# Patient Record
Sex: Male | Born: 2000 | Hispanic: Refuse to answer | Marital: Single | Smoking: Never smoker
Health system: Southern US, Community
[De-identification: ages and names within clinical notes are randomized; demographics above are authoritative.]

## PROBLEM LIST (undated history)

## (undated) DIAGNOSIS — J45909 Unspecified asthma, uncomplicated: Secondary | ICD-10-CM

## (undated) HISTORY — DX: Unspecified asthma, uncomplicated: J45.909

---
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
## 2018-01-06 DIAGNOSIS — M66859 Spontaneous rupture of other tendons, unspecified thigh: Secondary | ICD-10-CM | POA: Diagnosis not present

## 2018-03-31 ENCOUNTER — Ambulatory Visit (INDEPENDENT_AMBULATORY_CARE_PROVIDER_SITE_OTHER): Payer: BLUE CROSS/BLUE SHIELD | Admitting: Family Medicine

## 2018-03-31 ENCOUNTER — Encounter: Payer: Self-pay | Admitting: Family Medicine

## 2018-03-31 VITALS — BP 144/76 | HR 78 | Temp 97.9°F | Resp 14

## 2018-03-31 DIAGNOSIS — J069 Acute upper respiratory infection, unspecified: Secondary | ICD-10-CM

## 2018-03-31 DIAGNOSIS — J4521 Mild intermittent asthma with (acute) exacerbation: Secondary | ICD-10-CM

## 2018-03-31 MED ORDER — DOXYCYCLINE HYCLATE 100 MG PO TABS: 100.0000 mg | ORAL_TABLET | Freq: Two times a day (BID) | ORAL | 0 refills | Status: DC

## 2018-03-31 NOTE — Progress Notes (Signed)
Patient presents today with symptoms of mild productive cough, nasal drainage, wheezing, shortness of breath for the last 4 days.  Patient has been using his Albuterol Nebulizer at home 5 times a day for relief of symptoms. He has been using his Albuterol Inhaler about twice a day.  Patient is also on Qvar and his mom has refilled the medication and is sending it to him today.  He has had a subjective fever. He is able to talk in complete sentences in the office today without difficulty. He denies any known allergens he has been around but suspects there maybe some dust since the heat has now been turned on where he lives.  He denies severe headache, nausea, vomiting, diarrhea, abdominal pain, extreme fatigue, chest pain, calf pain/swelling.  He denies smoking. Patient was seen back in the spring for similar symptoms induced by an allergen.  He states that since that time he has been well and has had no significant issues with his asthma.  He however does admit that he has been out of usual physical activity for a few months given his hamstring injury.  ROS: Negative except mentioned above. Vitals as per Epic. GENERAL: obese male, NAD HEENT: no significant pharyngeal erythema, no exudate, no erythema of TMs, no significant cervical LAD RESP: minimal expiratory wheeze at the bases, no accessory muscle use, can speak in complete sentences when talking CARD: RRR NEURO: CN II-XII grossly intact   A/P:URI, Asthma w/Exacerbation - will have patient start tapered oral steroids for 12 days, Doxycycline, continue inhalers and nebulizer treatment as prescribed and needed, encouraged patient to start oral antihistamine such as Claritin or Zyrtec daily for a week, no physical activity for now until symptoms have improved and no fever, patient is to check in with athletic trainer daily for interval change who will inform me of status, advised patient to call 911 or get to ER immediately if symptoms worsen as  discussed. Patient addresses understanding of plan and will seek help if needed. Will have patient assess for sleep apnea and recommend establishing care with a pulmonologist.Will make referral.

## 2018-05-05 DIAGNOSIS — J45901 Unspecified asthma with (acute) exacerbation: Secondary | ICD-10-CM | POA: Diagnosis not present

## 2018-05-05 DIAGNOSIS — Z1389 Encounter for screening for other disorder: Secondary | ICD-10-CM | POA: Diagnosis not present

## 2018-07-06 DIAGNOSIS — S8392XA Sprain of unspecified site of left knee, initial encounter: Secondary | ICD-10-CM | POA: Diagnosis not present

## 2018-11-17 ENCOUNTER — Other Ambulatory Visit: Payer: Self-pay | Admitting: *Deleted

## 2018-11-17 DIAGNOSIS — Z20822 Contact with and (suspected) exposure to covid-19: Secondary | ICD-10-CM

## 2018-11-17 DIAGNOSIS — J45909 Unspecified asthma, uncomplicated: Secondary | ICD-10-CM | POA: Diagnosis not present

## 2018-11-21 ENCOUNTER — Other Ambulatory Visit: Payer: Self-pay

## 2018-11-21 DIAGNOSIS — Z20822 Contact with and (suspected) exposure to covid-19: Secondary | ICD-10-CM

## 2018-11-26 LAB — NOVEL CORONAVIRUS, NAA: SARS-CoV-2, NAA: NOT DETECTED

## 2018-12-26 ENCOUNTER — Other Ambulatory Visit: Payer: Self-pay

## 2018-12-26 DIAGNOSIS — Z20822 Contact with and (suspected) exposure to covid-19: Secondary | ICD-10-CM

## 2018-12-27 LAB — NOVEL CORONAVIRUS, NAA: SARS-CoV-2, NAA: NOT DETECTED

## 2019-02-01 DIAGNOSIS — Z20828 Contact with and (suspected) exposure to other viral communicable diseases: Secondary | ICD-10-CM | POA: Diagnosis not present

## 2019-02-06 ENCOUNTER — Other Ambulatory Visit: Payer: Self-pay

## 2019-02-06 DIAGNOSIS — U071 COVID-19: Secondary | ICD-10-CM | POA: Diagnosis not present

## 2019-02-06 DIAGNOSIS — Z20822 Contact with and (suspected) exposure to covid-19: Secondary | ICD-10-CM

## 2019-02-06 DIAGNOSIS — R6889 Other general symptoms and signs: Secondary | ICD-10-CM | POA: Diagnosis not present

## 2019-02-08 ENCOUNTER — Other Ambulatory Visit: Payer: Self-pay | Admitting: Family Medicine

## 2019-02-08 LAB — NOVEL CORONAVIRUS, NAA: SARS-CoV-2, NAA: DETECTED — AB

## 2019-02-08 MED ORDER — ALBUTEROL SULFATE HFA 108 (90 BASE) MCG/ACT IN AERS: 2.0000 | INHALATION_SPRAY | RESPIRATORY_TRACT | 2 refills | Status: AC | PRN

## 2019-02-20 ENCOUNTER — Other Ambulatory Visit: Payer: PRIVATE HEALTH INSURANCE | Admitting: Family Medicine

## 2019-02-20 ENCOUNTER — Other Ambulatory Visit: Payer: Self-pay

## 2019-02-20 DIAGNOSIS — Z0000 Encounter for general adult medical examination without abnormal findings: Secondary | ICD-10-CM

## 2019-02-21 ENCOUNTER — Other Ambulatory Visit: Payer: Self-pay | Admitting: Family Medicine

## 2019-02-21 DIAGNOSIS — I400 Infective myocarditis: Secondary | ICD-10-CM

## 2019-02-21 DIAGNOSIS — Z8616 Personal history of COVID-19: Secondary | ICD-10-CM

## 2019-02-21 DIAGNOSIS — Z8619 Personal history of other infectious and parasitic diseases: Secondary | ICD-10-CM

## 2019-02-21 DIAGNOSIS — U071 COVID-19: Secondary | ICD-10-CM

## 2019-02-21 LAB — TROPONIN I: Troponin I: <0.01 ng/mL (ref 0.00–0.04)

## 2019-02-23 ENCOUNTER — Other Ambulatory Visit: Payer: Self-pay

## 2019-02-23 ENCOUNTER — Ambulatory Visit (INDEPENDENT_AMBULATORY_CARE_PROVIDER_SITE_OTHER): Payer: BLUE CROSS/BLUE SHIELD

## 2019-02-23 DIAGNOSIS — Z8619 Personal history of other infectious and parasitic diseases: Secondary | ICD-10-CM | POA: Diagnosis not present

## 2019-02-23 DIAGNOSIS — U071 COVID-19: Secondary | ICD-10-CM

## 2019-02-23 DIAGNOSIS — I400 Infective myocarditis: Secondary | ICD-10-CM | POA: Diagnosis not present

## 2019-02-23 DIAGNOSIS — Z8616 Personal history of COVID-19: Secondary | ICD-10-CM

## 2019-02-26 ENCOUNTER — Telehealth: Payer: Self-pay | Admitting: Family Medicine

## 2019-02-26 NOTE — Telephone Encounter (Signed)
Called and explained to patient that Cardiology has reviewed the ECG and Echocardiogram and has not informed me that any further cardiac workup is needed for patient to be able to resume physical activity. Patient is to seek medical attention if any cardiopulmonary symptoms develop once activity is resumed. Patient addresses understanding and has no further questions or concerns.  

## 2019-02-27 ENCOUNTER — Other Ambulatory Visit: Payer: PRIVATE HEALTH INSURANCE

## 2019-05-24 DIAGNOSIS — Z20822 Contact with and (suspected) exposure to covid-19: Secondary | ICD-10-CM | POA: Diagnosis not present

## 2019-05-28 DIAGNOSIS — Z20822 Contact with and (suspected) exposure to covid-19: Secondary | ICD-10-CM | POA: Diagnosis not present

## 2019-06-08 DIAGNOSIS — Z20822 Contact with and (suspected) exposure to covid-19: Secondary | ICD-10-CM | POA: Diagnosis not present

## 2019-06-11 DIAGNOSIS — Z20822 Contact with and (suspected) exposure to covid-19: Secondary | ICD-10-CM | POA: Diagnosis not present

## 2019-06-13 DIAGNOSIS — Z20822 Contact with and (suspected) exposure to covid-19: Secondary | ICD-10-CM | POA: Diagnosis not present

## 2019-06-15 DIAGNOSIS — Z20822 Contact with and (suspected) exposure to covid-19: Secondary | ICD-10-CM | POA: Diagnosis not present

## 2019-06-18 DIAGNOSIS — Z20822 Contact with and (suspected) exposure to covid-19: Secondary | ICD-10-CM | POA: Diagnosis not present

## 2019-06-20 DIAGNOSIS — Z20822 Contact with and (suspected) exposure to covid-19: Secondary | ICD-10-CM | POA: Diagnosis not present

## 2019-06-22 DIAGNOSIS — Z20822 Contact with and (suspected) exposure to covid-19: Secondary | ICD-10-CM | POA: Diagnosis not present

## 2019-06-25 DIAGNOSIS — Z20822 Contact with and (suspected) exposure to covid-19: Secondary | ICD-10-CM | POA: Diagnosis not present

## 2019-06-27 DIAGNOSIS — S40012A Contusion of left shoulder, initial encounter: Secondary | ICD-10-CM | POA: Diagnosis not present

## 2019-06-29 DIAGNOSIS — Z20822 Contact with and (suspected) exposure to covid-19: Secondary | ICD-10-CM | POA: Diagnosis not present

## 2019-07-02 DIAGNOSIS — Z20822 Contact with and (suspected) exposure to covid-19: Secondary | ICD-10-CM | POA: Diagnosis not present

## 2019-07-04 DIAGNOSIS — Z20822 Contact with and (suspected) exposure to covid-19: Secondary | ICD-10-CM | POA: Diagnosis not present

## 2019-07-07 DIAGNOSIS — Z20822 Contact with and (suspected) exposure to covid-19: Secondary | ICD-10-CM | POA: Diagnosis not present

## 2019-07-09 DIAGNOSIS — Z20822 Contact with and (suspected) exposure to covid-19: Secondary | ICD-10-CM | POA: Diagnosis not present

## 2019-07-11 DIAGNOSIS — Z20822 Contact with and (suspected) exposure to covid-19: Secondary | ICD-10-CM | POA: Diagnosis not present

## 2019-07-14 DIAGNOSIS — Z20822 Contact with and (suspected) exposure to covid-19: Secondary | ICD-10-CM | POA: Diagnosis not present

## 2019-07-16 DIAGNOSIS — Z20822 Contact with and (suspected) exposure to covid-19: Secondary | ICD-10-CM | POA: Diagnosis not present

## 2019-07-18 DIAGNOSIS — Z20822 Contact with and (suspected) exposure to covid-19: Secondary | ICD-10-CM | POA: Diagnosis not present

## 2019-07-20 DIAGNOSIS — Z20822 Contact with and (suspected) exposure to covid-19: Secondary | ICD-10-CM | POA: Diagnosis not present

## 2019-07-23 DIAGNOSIS — Z20822 Contact with and (suspected) exposure to covid-19: Secondary | ICD-10-CM | POA: Diagnosis not present

## 2019-07-25 DIAGNOSIS — Z20822 Contact with and (suspected) exposure to covid-19: Secondary | ICD-10-CM | POA: Diagnosis not present

## 2019-07-26 DIAGNOSIS — S40012D Contusion of left shoulder, subsequent encounter: Secondary | ICD-10-CM | POA: Diagnosis not present

## 2019-07-27 DIAGNOSIS — Z20822 Contact with and (suspected) exposure to covid-19: Secondary | ICD-10-CM | POA: Diagnosis not present

## 2019-07-30 DIAGNOSIS — Z20822 Contact with and (suspected) exposure to covid-19: Secondary | ICD-10-CM | POA: Diagnosis not present

## 2019-08-01 DIAGNOSIS — Z20822 Contact with and (suspected) exposure to covid-19: Secondary | ICD-10-CM | POA: Diagnosis not present

## 2019-08-04 DIAGNOSIS — Z20822 Contact with and (suspected) exposure to covid-19: Secondary | ICD-10-CM | POA: Diagnosis not present

## 2019-08-06 DIAGNOSIS — Z20822 Contact with and (suspected) exposure to covid-19: Secondary | ICD-10-CM | POA: Diagnosis not present

## 2019-08-08 DIAGNOSIS — Z20822 Contact with and (suspected) exposure to covid-19: Secondary | ICD-10-CM | POA: Diagnosis not present

## 2019-08-11 DIAGNOSIS — Z20822 Contact with and (suspected) exposure to covid-19: Secondary | ICD-10-CM | POA: Diagnosis not present

## 2019-08-13 DIAGNOSIS — Z20822 Contact with and (suspected) exposure to covid-19: Secondary | ICD-10-CM | POA: Diagnosis not present

## 2019-08-15 DIAGNOSIS — Z20822 Contact with and (suspected) exposure to covid-19: Secondary | ICD-10-CM | POA: Diagnosis not present

## 2019-08-22 DIAGNOSIS — Z20822 Contact with and (suspected) exposure to covid-19: Secondary | ICD-10-CM | POA: Diagnosis not present

## 2019-08-29 DIAGNOSIS — Z20822 Contact with and (suspected) exposure to covid-19: Secondary | ICD-10-CM | POA: Diagnosis not present

## 2019-09-05 DIAGNOSIS — Z20822 Contact with and (suspected) exposure to covid-19: Secondary | ICD-10-CM | POA: Diagnosis not present

## 2019-09-07 DIAGNOSIS — Z20822 Contact with and (suspected) exposure to covid-19: Secondary | ICD-10-CM | POA: Diagnosis not present

## 2019-09-12 DIAGNOSIS — Z20822 Contact with and (suspected) exposure to covid-19: Secondary | ICD-10-CM | POA: Diagnosis not present

## 2019-09-20 DIAGNOSIS — Z20822 Contact with and (suspected) exposure to covid-19: Secondary | ICD-10-CM | POA: Diagnosis not present

## 2019-11-23 DIAGNOSIS — J45901 Unspecified asthma with (acute) exacerbation: Secondary | ICD-10-CM | POA: Diagnosis not present

## 2019-12-05 DIAGNOSIS — R05 Cough: Secondary | ICD-10-CM | POA: Diagnosis not present

## 2019-12-05 DIAGNOSIS — J029 Acute pharyngitis, unspecified: Secondary | ICD-10-CM | POA: Diagnosis not present

## 2019-12-05 DIAGNOSIS — Z6838 Body mass index (BMI) 38.0-38.9, adult: Secondary | ICD-10-CM | POA: Diagnosis not present

## 2019-12-05 DIAGNOSIS — B349 Viral infection, unspecified: Secondary | ICD-10-CM | POA: Diagnosis not present

## 2019-12-05 DIAGNOSIS — Z20822 Contact with and (suspected) exposure to covid-19: Secondary | ICD-10-CM | POA: Diagnosis not present

## 2019-12-05 DIAGNOSIS — J22 Unspecified acute lower respiratory infection: Secondary | ICD-10-CM | POA: Diagnosis not present

## 2019-12-05 DIAGNOSIS — R0981 Nasal congestion: Secondary | ICD-10-CM | POA: Diagnosis not present

## 2020-03-28 DIAGNOSIS — Z113 Encounter for screening for infections with a predominantly sexual mode of transmission: Secondary | ICD-10-CM | POA: Diagnosis not present

## 2020-04-01 DIAGNOSIS — M79671 Pain in right foot: Secondary | ICD-10-CM | POA: Diagnosis not present

## 2020-04-05 DIAGNOSIS — M79671 Pain in right foot: Secondary | ICD-10-CM | POA: Diagnosis not present

## 2020-04-05 DIAGNOSIS — S99929A Unspecified injury of unspecified foot, initial encounter: Secondary | ICD-10-CM | POA: Diagnosis not present

## 2020-04-05 DIAGNOSIS — R609 Edema, unspecified: Secondary | ICD-10-CM | POA: Diagnosis not present

## 2020-05-06 DIAGNOSIS — J45909 Unspecified asthma, uncomplicated: Secondary | ICD-10-CM | POA: Diagnosis not present

## 2020-09-29 ENCOUNTER — Emergency Department
Admission: EM | Admit: 2020-09-29 | Discharge: 2020-09-29 | Disposition: A | Discharge status: Home / Self Care | Payer: BLUE CROSS/BLUE SHIELD | Attending: Emergency Medicine | Admitting: Emergency Medicine

## 2020-09-29 ENCOUNTER — Emergency Department
Admission: EM | Admit: 2020-09-29 | Discharge: 2020-09-29 | Disposition: A | Discharge status: Home / Self Care | Payer: BLUE CROSS/BLUE SHIELD

## 2020-09-29 ENCOUNTER — Other Ambulatory Visit: Payer: Self-pay

## 2020-09-29 ENCOUNTER — Emergency Department: Payer: BLUE CROSS/BLUE SHIELD

## 2020-09-29 DIAGNOSIS — S6991XA Unspecified injury of right wrist, hand and finger(s), initial encounter: Secondary | ICD-10-CM | POA: Diagnosis present

## 2020-09-29 DIAGNOSIS — X58XXXA Exposure to other specified factors, initial encounter: Secondary | ICD-10-CM | POA: Diagnosis not present

## 2020-09-29 DIAGNOSIS — S62324A Displaced fracture of shaft of fourth metacarpal bone, right hand, initial encounter for closed fracture: Secondary | ICD-10-CM | POA: Insufficient documentation

## 2020-09-29 DIAGNOSIS — Z7951 Long term (current) use of inhaled steroids: Secondary | ICD-10-CM | POA: Diagnosis not present

## 2020-09-29 DIAGNOSIS — J45909 Unspecified asthma, uncomplicated: Secondary | ICD-10-CM | POA: Diagnosis not present

## 2020-09-29 DIAGNOSIS — T148XXA Other injury of unspecified body region, initial encounter: Secondary | ICD-10-CM

## 2020-09-29 MED ORDER — CEPHALEXIN 500 MG PO CAPS: 500.0000 mg | ORAL_CAPSULE | Freq: Four times a day (QID) | ORAL | 0 refills | Status: AC

## 2020-09-29 MED ORDER — DOXYCYCLINE HYCLATE 100 MG PO TBEC: 100.0000 mg | DELAYED_RELEASE_TABLET | Freq: Two times a day (BID) | ORAL | 0 refills | Status: AC

## 2020-09-29 NOTE — ED Triage Notes (Signed)
Pt to ED POV for injury to right hand. States he punched a pole last night,  Swelling noted to left hand. Laceration noted to top of hand, no bleeding at this time

## 2020-09-29 NOTE — ED Notes (Signed)
See triage note  Presents with injury to right hand  States he was fooling around and hit a pole  Swelling and pain with small laceration

## 2020-09-29 NOTE — ED Provider Notes (Signed)
[facility]-EMERGENCY DEPARTMENT  ____________________________________________  Time seen: Approximately 4:18 PM  I have reviewed the triage vital signs and the nursing notes.   HISTORY  Chief Complaint Laceration   Historian Patient     HPI Louis Benson is a 20 y.o. male presents to the emergency department with acute right hand pain.  Patient reports that last night he became intoxicated and thinks that he hit a metal pole with his hand.  Patient has a 2 cm in length by 1 cm in width macerated laceration along the dorsal aspect of the right hand in the distribution of the right fourth metacarpal with some overlying slough.  He has had some dull aching pain in the affected area.  His tetanus status is up-to-date.  No other alleviating measures have been attempted.   Past Medical History:  Diagnosis Date  . Asthma two years ago     Immunizations up to date:  Yes.     Past Medical History:  Diagnosis Date  . Asthma two years ago    There are no problems to display for this patient.   Past Surgical History:  Procedure Laterality Date  . HAND AMPUTATION Left 2014    Prior to Admission medications   Medication Sig Start Date End Date Taking? Authorizing Provider  cephALEXin (KEFLEX) 500 MG capsule Take 1 capsule (500 mg total) by mouth 4 (four) times daily for 7 days. 09/29/20 10/06/20 Yes Pia Mau M, PA-C  doxycycline (DORYX) 100 MG EC tablet Take 1 tablet (100 mg total) by mouth 2 (two) times daily for 10 days. 09/29/20 10/09/20 Yes Pia Mau M, PA-C  albuterol (VENTOLIN HFA) 108 (90 Base) MCG/ACT inhaler Inhale 2 puffs into the lungs every 4 (four) hours as needed for wheezing or shortness of breath. 02/08/19   Jolene Provost, MD  beclomethasone (QVAR REDIHALER) 80 MCG/ACT inhaler Inhale 2 puffs into the lungs 2 (two) times daily. 08/17/17   Jolene Provost, MD  chlorhexidine (HIBICLENS) 4 % external liquid Apply to area on knee and wash off once daily. 12/24/17    Jolene Provost, MD    Allergies Patient has no known allergies.  Family History  Problem Relation Age of Onset  . Diabetes Mother   . Hypertension Father   . Diabetes Maternal Grandmother   . Heart disease Paternal Grandfather   . Hypertension Paternal Grandfather   . Heart attack Paternal Grandfather     Social History Social History   Tobacco Use  . Smoking status: Never Smoker  . Smokeless tobacco: Never Used  Substance Use Topics  . Alcohol use: Yes     Review of Systems  Constitutional: No fever/chills Eyes:  No discharge ENT: No upper respiratory complaints. Respiratory: no cough. No SOB/ use of accessory muscles to breath Gastrointestinal:   No nausea, no vomiting.  No diarrhea.  No constipation. Musculoskeletal: Patient has laceration  Skin: Negative for rash, abrasions, lacerations, ecchymosis.    ____________________________________________   PHYSICAL EXAM:  VITAL SIGNS: ED Triage Vitals  Enc Vitals Group     BP 09/29/20 1435 (!) 160/86     Pulse Rate 09/29/20 1435 73     Resp 09/29/20 1435 18     Temp 09/29/20 1435 98.8 F (37.1 C)     Temp Source 09/29/20 1435 Oral     SpO2 09/29/20 1435 98 %     Weight 09/29/20 1436 240 lb (108.9 kg)     Height 09/29/20 1436 6\' 1"  (1.854 m)     Head  Circumference --      Peak Flow --      Pain Score 09/29/20 1436 5     Pain Loc --      Pain Edu? --      Excl. in GC? --      Constitutional: Alert and oriented. Well appearing and in no acute distress. Eyes: Conjunctivae are normal. PERRL. EOMI. Head: Atraumatic. ENT: Cardiovascular: Normal rate, regular rhythm. Normal S1 and S2.  Good peripheral circulation. Respiratory: Normal respiratory effort without tachypnea or retractions. Lungs CTAB. Good air entry to the bases with no decreased or absent breath sounds Gastrointestinal: Bowel sounds x 4 quadrants. Soft and nontender to palpation. No guarding or rigidity. No distention. Musculoskeletal: Patient  has swelling over the dorsal aspect of the right hand with a 2 cm x 1 cm laceration that is macerated and does not reapproximate well with some overlying slough and evidence of grass and dirt foreign bodies.  Palpable radial pulse, right.  Capillary refills less than 2 seconds on the right. Neurologic:  Normal for age. No gross focal neurologic deficits are appreciated.  Skin:  Skin is warm, dry and intact. No rash noted. Psychiatric: Mood and affect are normal for age. Speech and behavior are normal.   ____________________________________________   LABS (all labs ordered are listed, but only abnormal results are displayed)  Labs Reviewed - No data to display ____________________________________________  EKG   ____________________________________________  RADIOLOGY Geraldo Pitter, personally viewed and evaluated these images (plain radiographs) as part of my medical decision making, as well as reviewing the written report by the radiologist.  DG Hand Complete Right  Result Date: 09/29/2020 CLINICAL DATA:  Patient presents with injury to the right hand. EXAM: RIGHT HAND - COMPLETE 3+ VIEW COMPARISON:  None. FINDINGS: There is a displaced and angulated fracture of the mid shaft of the fourth metacarpal. No evidence of dislocation. No additional fracture identified in the right hand. No significant arthropathy. There is regional soft tissue swelling. No radiopaque foreign body. IMPRESSION: Displaced and angulated fracture of the mid shaft of the fourth metacarpal in the right hand. Electronically Signed   By: Emmaline Kluver M.D.   On: 09/29/2020 15:33    ____________________________________________    PROCEDURES  Procedure(s) performed:     Procedures     Medications - No data to display   ____________________________________________   INITIAL IMPRESSION / ASSESSMENT AND PLAN / ED COURSE  Pertinent labs & imaging results that were available during my care of the  patient were reviewed by me and considered in my medical decision making (see chart for details).      Assessment and plan Open metacarpal fracture 20 year old male presents to the emergency department with acute right hand pain with laceration overlying the dorsal aspect of the right hand.  Patient was hypertensive at triage vital signs were otherwise reassuring.  Patient had a 2 cm x 1 cm macerated laceration deep to underlying adipose tissue with multiple soft tissue foreign bodies.  Patient's wound was copiously irrigated with normal saline and several soft tissue foreign bodies were removed.  X-ray of the right hand shows an angulated metacarpal fracture.  Orthopedist on-call, Dr. Rosita Kea was consulted who recommended empiric antibiotic and splinting with anticipation and seeing patient in the office early this week.  Patient was started on both doxycycline and Keflex.  His tetanus status is up-to-date.  Return precautions were given to return with new or worsening symptoms.     ____________________________________________  FINAL CLINICAL IMPRESSION(S) / ED DIAGNOSES  Final diagnoses:  Open fracture      NEW MEDICATIONS STARTED DURING THIS VISIT:  ED Discharge Orders         Ordered    doxycycline (DORYX) 100 MG EC tablet  2 times daily        09/29/20 1613    cephALEXin (KEFLEX) 500 MG capsule  4 times daily        09/29/20 1613              This chart was dictated using voice recognition software/Dragon. Despite best efforts to proofread, errors can occur which can change the meaning. Any change was purely unintentional.     Orvil Feil, PA-C 09/29/20 1622    Phineas Semen, MD 09/29/20 [phone]

## 2020-09-29 NOTE — Discharge Instructions (Signed)
Take doxycycline twice daily. Take Keflex 4 times daily. Please call Dr. Rosita Kea office in the morning for an appointment.

## 2021-07-09 IMAGING — DX DG HAND COMPLETE 3+V*R*
3 series · 3 of 3 positions shown · non-contrast
Comparison: None.

CLINICAL DATA: Patient presents with injury to the right hand.

EXAM:
RIGHT HAND - COMPLETE 3+ VIEW

[hand ap]
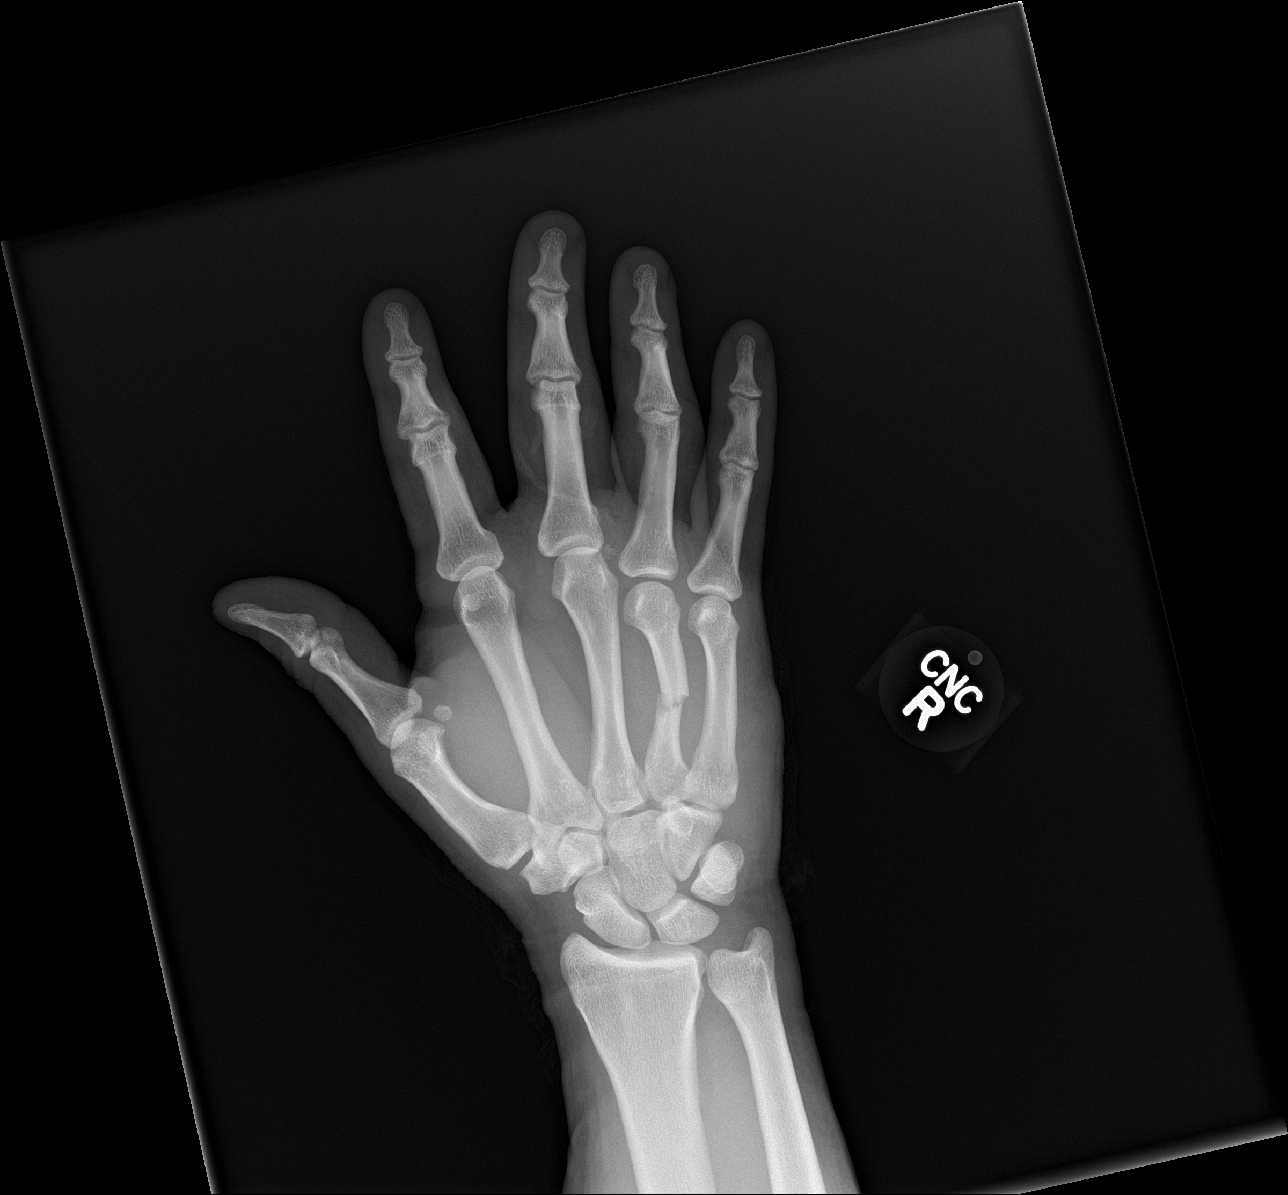

[hand obl]
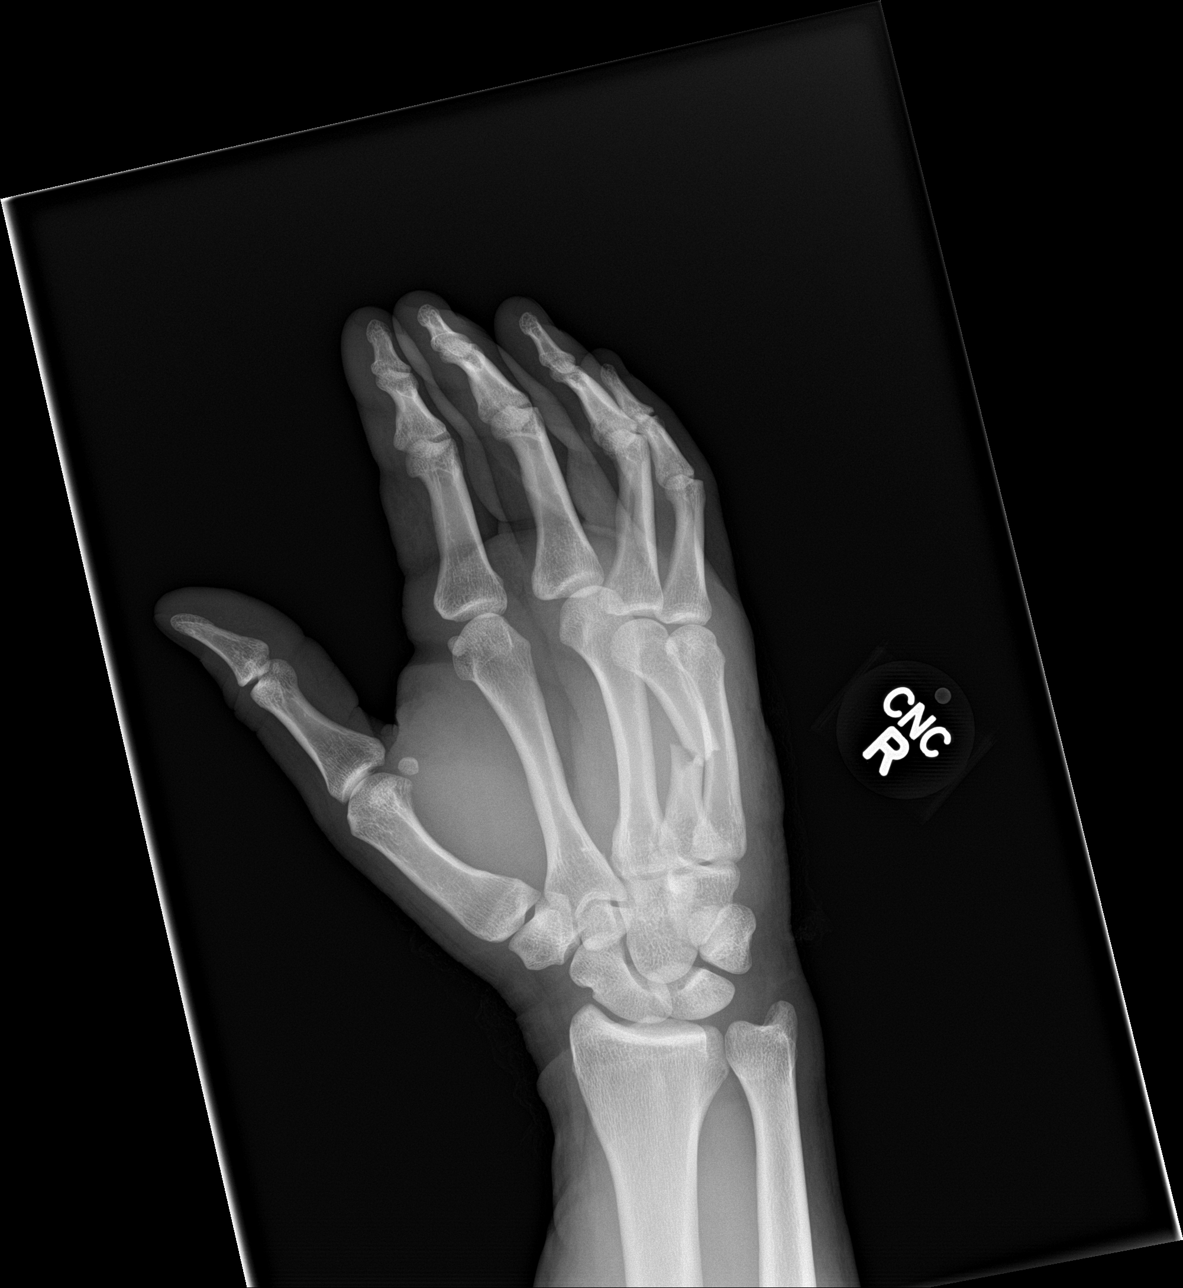

[hand lat]
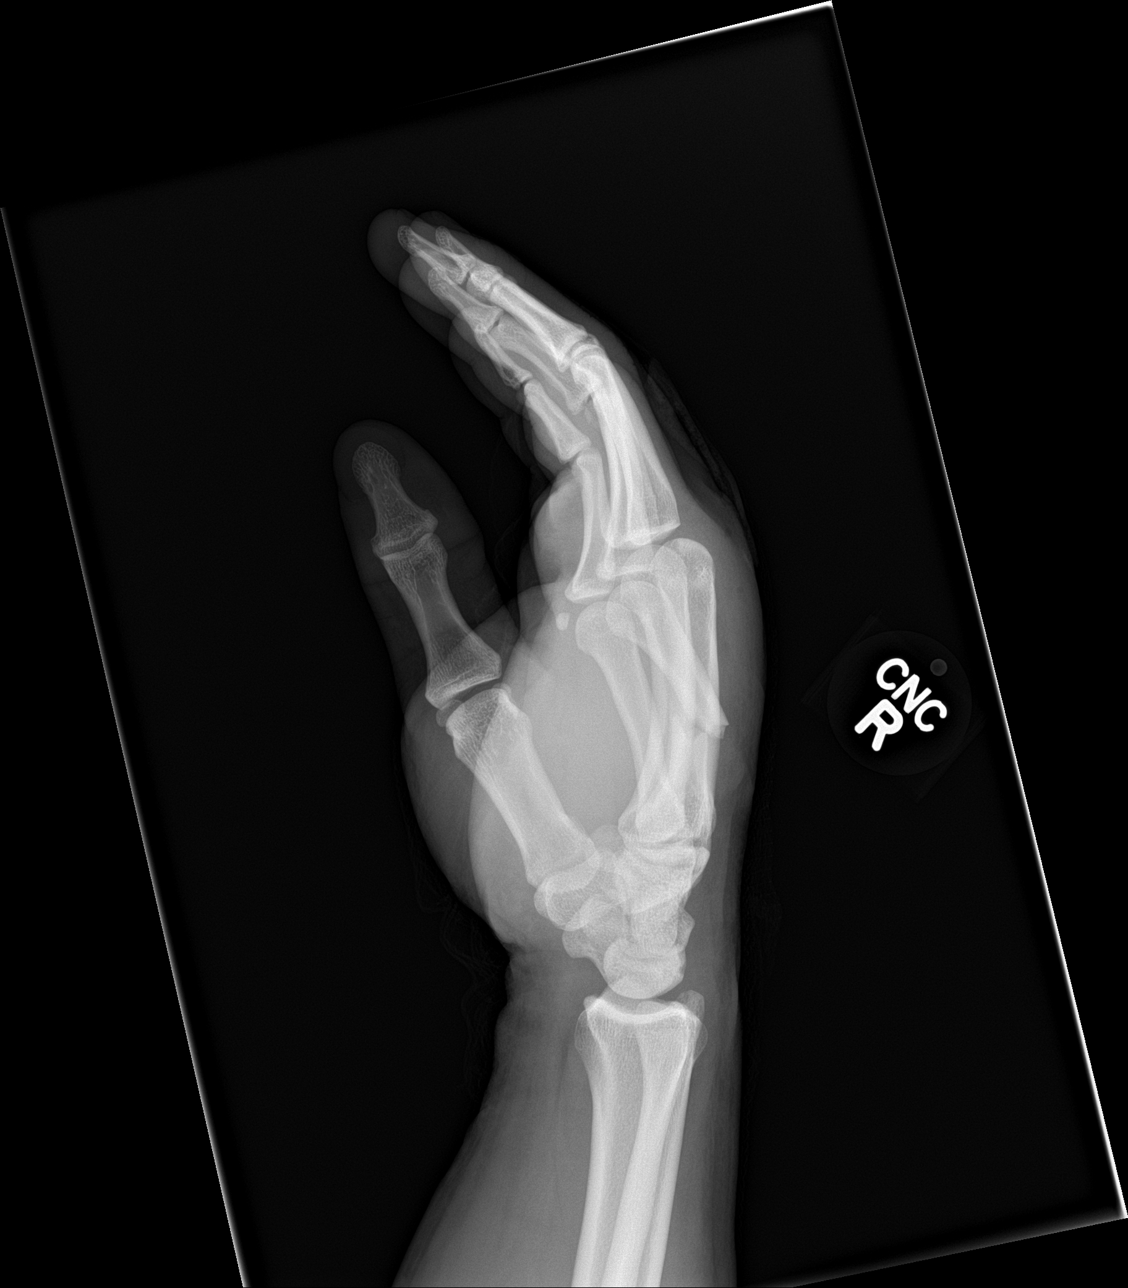

[3 of 3 positions shown; findings below may reference images not displayed]

FINDINGS: There is a displaced and angulated fracture of the mid shaft of the
fourth metacarpal. No evidence of dislocation. No additional
fracture identified in the right hand. No significant arthropathy.
There is regional soft tissue swelling. No radiopaque foreign body.
IMPRESSION: Displaced and angulated fracture of the mid shaft of the fourth
metacarpal in the right hand.
